# Patient Record
Sex: Male | Born: 1985 | Race: White | Hispanic: No | Marital: Married | Smoking: Never smoker
Health system: Southern US, Community
[De-identification: ages and names within clinical notes are randomized; demographics above are authoritative.]

---

## 2017-06-01 ENCOUNTER — Encounter: Payer: Self-pay | Admitting: Emergency Medicine

## 2017-06-01 ENCOUNTER — Emergency Department: Payer: Self-pay

## 2017-06-01 ENCOUNTER — Emergency Department
Admission: EM | Admit: 2017-06-01 | Discharge: 2017-06-01 | Disposition: A | Discharge status: Home / Self Care | Payer: Self-pay | Attending: Emergency Medicine | Admitting: Emergency Medicine

## 2017-06-01 ENCOUNTER — Other Ambulatory Visit: Payer: Self-pay

## 2017-06-01 DIAGNOSIS — Y9389 Activity, other specified: Secondary | ICD-10-CM | POA: Insufficient documentation

## 2017-06-01 DIAGNOSIS — Y92013 Bedroom of single-family (private) house as the place of occurrence of the external cause: Secondary | ICD-10-CM | POA: Insufficient documentation

## 2017-06-01 DIAGNOSIS — S0300XA Dislocation of jaw, unspecified side, initial encounter: Secondary | ICD-10-CM

## 2017-06-01 DIAGNOSIS — S0302XA Dislocation of jaw, left side, initial encounter: Secondary | ICD-10-CM | POA: Insufficient documentation

## 2017-06-01 DIAGNOSIS — X58XXXA Exposure to other specified factors, initial encounter: Secondary | ICD-10-CM | POA: Insufficient documentation

## 2017-06-01 DIAGNOSIS — Y999 Unspecified external cause status: Secondary | ICD-10-CM | POA: Insufficient documentation

## 2017-06-01 MED ORDER — MIDAZOLAM HCL 5 MG/5ML IJ SOLN: 2.0000 mg | Freq: Once | INTRAMUSCULAR | Status: DC

## 2017-06-01 NOTE — ED Notes (Signed)
Patient transported to CT 

## 2017-06-01 NOTE — ED Notes (Signed)
Pt back to room att 

## 2017-06-01 NOTE — ED Triage Notes (Signed)
Patient dislocated his jaw about 20 minutes ago.

## 2017-06-01 NOTE — ED Notes (Signed)
Pt reports waking up to left side jaw pain with the inability to close mouth completely,   Jaw appears shifted to the right, reduced ability to swallow, speech deficit, pt talkitive, accompanied by brother, no apparent distress

## 2017-06-01 NOTE — ED Provider Notes (Signed)
   [facility] Emergency Department Provider Note     PROCEDURES  Procedure(s) performed: no  Reduction of dislocation Date/Time: 06/01/2017 6:56 AM Performed by: Merrily Brittleifenbark, [place], MD Authorized by: Merrily Brittleifenbark, Airen Stiehl, MD  Consent: Verbal consent obtained. Consent given by: patient Patient understanding: patient states understanding of the procedure being performed Patient consent: the patient's understanding of the procedure matches consent given Procedure consent: procedure consent matches procedure scheduled Relevant documents: relevant documents present and verified Test results: test results available and properly labeled Site marked: the operative site was marked Imaging studies: imaging studies available Local anesthesia used: no  Anesthesia: Local anesthesia used: no  Sedation: Patient sedated: no  Patient tolerance: Patient tolerated the procedure well with no immediate complications Comments: Successfully reduce the patient's left jaw dislocation using an extraoral technique.  He tolerated well with no complications.      ____________________________________________   FINAL CLINICAL IMPRESSION(S) / ED DIAGNOSES  Final diagnoses:  None         Merrily Brittleifenbark, Viviene Thurston, MD 06/01/17 [phone]

## 2017-06-01 NOTE — ED Provider Notes (Signed)
[facility] Emergency Department Provider Note   ____________________________________________   First MD Initiated Contact with Patient 06/01/17 [id number]     (approximate)  I have reviewed the triage vital signs and the nursing notes.   HISTORY  Chief Complaint Jaw Pain    HPI Mitchell Clarke is a 32 y.o. male who presents to the ED from home with a chief complaint of left jaw dislocation.  No history of same.  Patient was getting ready to go to bed, popped the left side of his neck and now unable to close his jaw.  States he has been having a left upper toothache and biting on a fleshy part of his cheek prior to the incident.  Denies fall/trauma/injury.  Reports eating pizza and drinking beers up until 4 hours ago.  Voices no other medical complaints.  This effectively denies recent fever, chills, chest pain, shortness of breath, abdominal pain, nausea, vomiting.  Does state that his jaw usually clicks when he eats.   Past medical history None  There are no active problems to display for this patient.   History reviewed. No pertinent surgical history.  Prior to Admission medications   Not on File    Allergies Sulfa antibiotics  No family history on file.  Social History Social History   Tobacco Use  . Smoking status: Never Smoker  . Smokeless tobacco: Never Used  Substance Use Topics  . Alcohol use: Not on file  . Drug use: Not on file    Review of Systems  Constitutional: No fever/chills. Eyes: No visual changes. ENT: Positive for left jaw issue.  No sore throat. Cardiovascular: Denies chest pain. Respiratory: Denies shortness of breath. Gastrointestinal: No abdominal pain.  No nausea, no vomiting.  No diarrhea.  No constipation. Genitourinary: Negative for dysuria. Musculoskeletal: Negative for back pain. Skin: Negative for rash. Neurological: Negative for headaches, focal weakness or  numbness.   ____________________________________________   PHYSICAL EXAM:  VITAL SIGNS: ED Triage Vitals  Enc Vitals Group     BP 06/01/17 0546 (!) 143/101     Pulse Rate 06/01/17 0546 (!) 124     Resp 06/01/17 0546 18     Temp 06/01/17 0546 97.9 F (36.6 C)     Temp Source 06/01/17 0546 Oral     SpO2 06/01/17 0546 98 %     Weight 06/01/17 0545 270 lb (122.5 kg)     Height 06/01/17 0545 5\' 7"  (1.702 m)     Head Circumference --      Peak Flow --      Pain Score 06/01/17 0544 7     Pain Loc --      Pain Edu? --      Excl. in GC? --     Constitutional: Alert and oriented. Well appearing and in no acute distress.  Laughing with his brother. Eyes: Conjunctivae are normal. PERRL. EOMI. Head: Atraumatic. Nose: No congestion/rhinnorhea. Mouth/Throat: Left jaw unable to close.  Speaking clearly.  Able to widely open jaw without difficulty.  Small ball shaped fleshy tissue stemming from left cheek.  Patient feels like he is biting on this which prevents him from closing his mouth.  It does not appear to be the case that the tissue is preventing him from closing his mouth. Neck: No stridor.  Cervical spine tenderness to palpation. Cardiovascular: Normal rate, regular rhythm. Grossly normal heart sounds.  Good peripheral circulation. Respiratory: Normal respiratory effort.  No retractions. Lungs CTAB. Gastrointestinal: Soft and  nontender. No distention. No abdominal bruits. No CVA tenderness. Musculoskeletal: No lower extremity tenderness nor edema.  No joint effusions. Neurologic:  Normal speech and language. No gross focal neurologic deficits are appreciated. No gait instability. Skin:  Skin is warm, dry and intact. No rash noted. Psychiatric: Mood and affect are normal. Speech and behavior are normal.  ____________________________________________   LABS (all labs ordered are listed, but only abnormal results are displayed)  Labs Reviewed - No data to  display ____________________________________________  EKG  None ____________________________________________  RADIOLOGY  ED MD interpretation: Anterior left TMJ dislocation  Official radiology report(s): Ct Maxillofacial Wo Cm  Result Date: 06/01/2017 CLINICAL DATA:  Jaw dislocation EXAM: CT MAXILLOFACIAL WITHOUT CONTRAST TECHNIQUE: Multidetector CT imaging of the maxillofacial structures was performed. Multiplanar CT image reconstructions were also generated. COMPARISON:  None. FINDINGS: Osseous: --Complex facial fracture types: No LeFort, zygomaticomaxillary complex or nasoorbitoethmoidal fracture. --Simple fracture types: None. --Mandible, hard palate and teeth: There is anterior dislocation at the left temporomandibular joint. No fracture. Orbits: The globes appear intact. Normal appearance of the intra- and extraconal fat. Symmetric extraocular muscles. Sinuses: No fluid levels or advanced mucosal thickening. Soft tissues: Normal visualized extracranial soft tissues. Limited intracranial: Normal. IMPRESSION: Anterior dislocation of the left temporomandibular joint. No fracture. Electronically Signed   By: Deatra RobinsonKevin  Herman M.D.   On: 06/01/2017 06:57    ____________________________________________   PROCEDURES  Procedure(s) performed: None  Procedures  Critical Care performed: No  ____________________________________________   INITIAL IMPRESSION / ASSESSMENT AND PLAN / ED COURSE  As part of my medical decision making, I reviewed the following data within the electronic MEDICAL RECORD NUMBER History obtained from family, Nursing notes reviewed and incorporated, Radiograph reviewed and Notes from prior ED visits   32 year old male who presents with probable left jaw dislocation, unable to close.  Tried traditional reduction at the bedside without sedation.  Also attempted extraoral reduction without success.  Jaw appears to sublux.   Clinical Course as of Jun 01 701  Sun Jun 01, 2017  0701 My colleague Dr. Lamont Snowballifenbark attempted extraoral reduction successfully.  Patient is able to open and close his jaw fully without discomfort.  [facility].  Strict return precautions given.  Patient and his brother verbalize understanding and agree with plan of care.   [JS]    Clinical Course User Index [JS] Irean HongSung, Jade J, MD     ____________________________________________   FINAL CLINICAL IMPRESSION(S) / ED DIAGNOSES  Final diagnoses:  Closed dislocation of jaw, initial encounter     ED Discharge Orders    None       Note:  This document was prepared using Dragon voice recognition software and may include unintentional dictation errors.    Irean HongSung, Jade J, MD 06/01/17 [phone]

## 2017-06-01 NOTE — Discharge Instructions (Addendum)
Do not pop your neck like you were doing when your jaw dislocated.  Avoid opening your jaw very wide as much as possible.  Return to the ER for recurrent or worsening symptoms, persistent vomiting, difficulty breathing or other concerns.

## 2018-09-18 IMAGING — CT CT MAXILLOFACIAL W/O CM
3 series · 16 of 47 positions shown, 19 images · non-contrast
Comparison: None.

CLINICAL DATA: Jaw dislocation

EXAM:
CT MAXILLOFACIAL WITHOUT CONTRAST
TECHNIQUE: Multidetector CT imaging of the maxillofacial structures was
performed. Multiplanar CT image reconstructions were also generated.

[Series 2: max soft · axial · 0.36mm/px · z∈[-202,-34]mm · 10 of 98 slices shown, 13 images]
[im 7/98  brain]
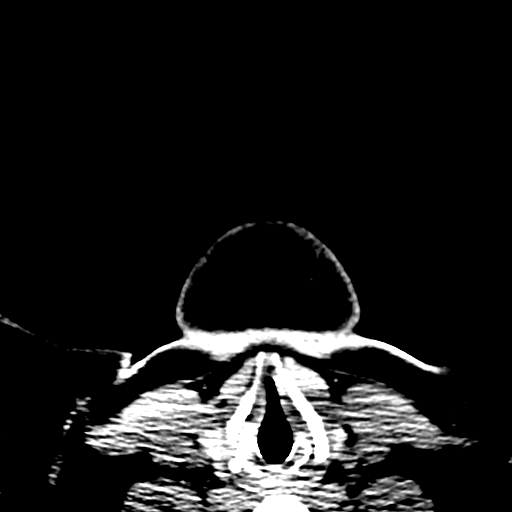
[im 7/98  bone]
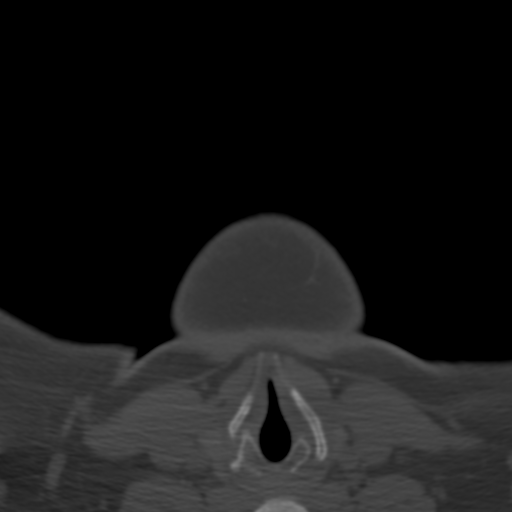
[im 17/98  bone]
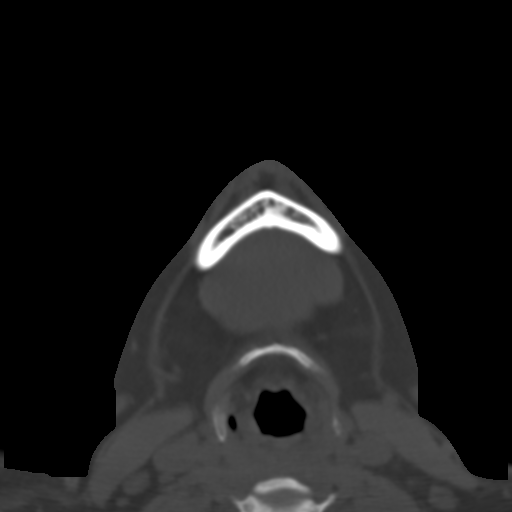
[im 27/98  bone]
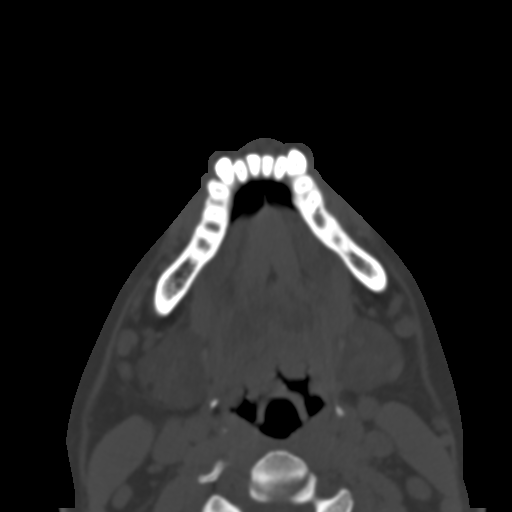
[im 34/98  bone]
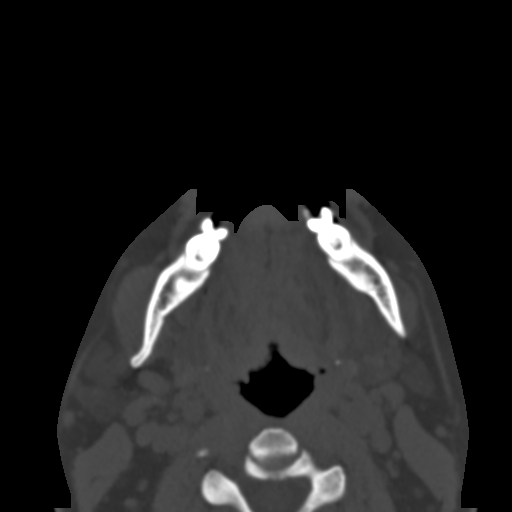
[im 44/98  brain]
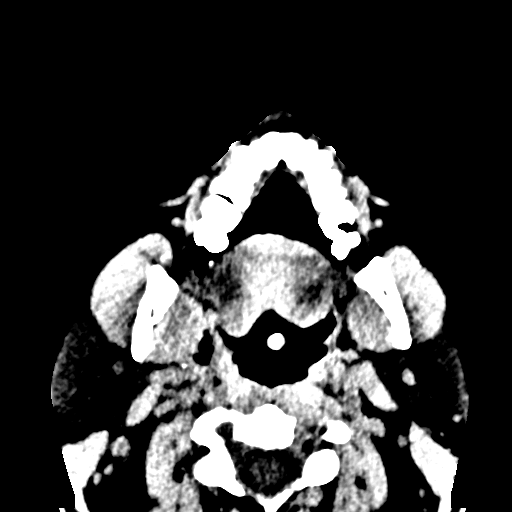
[im 44/98  bone]
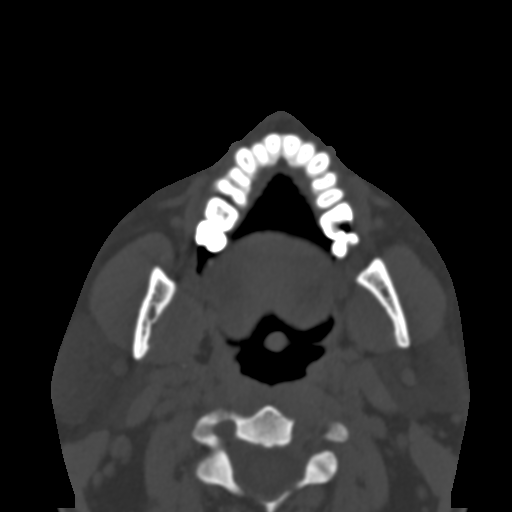
[im 54/98  bone]
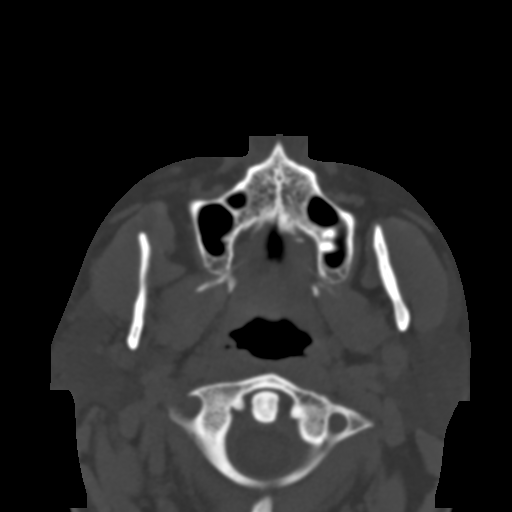
[im 64/98  bone]
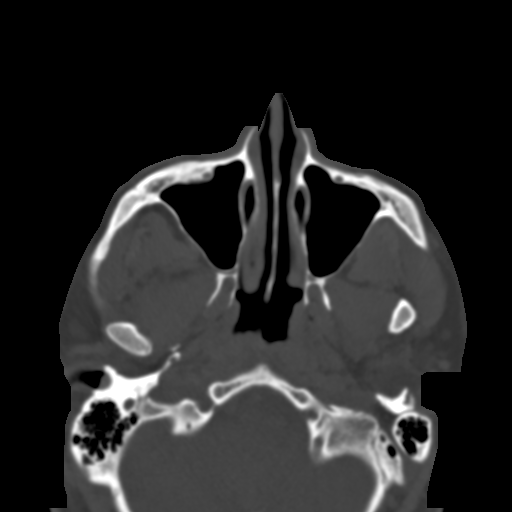
[im 74/98  bone]
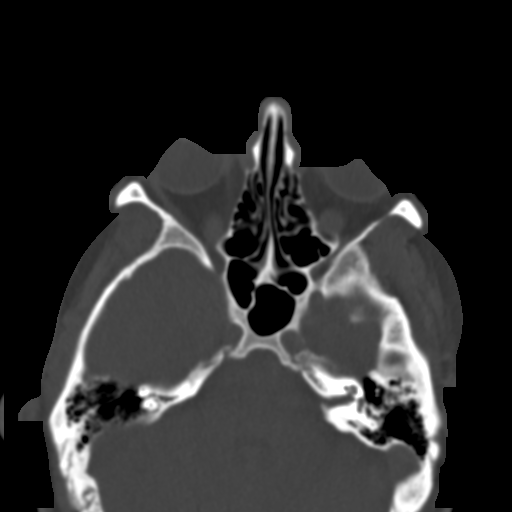
[im 81/98  brain]
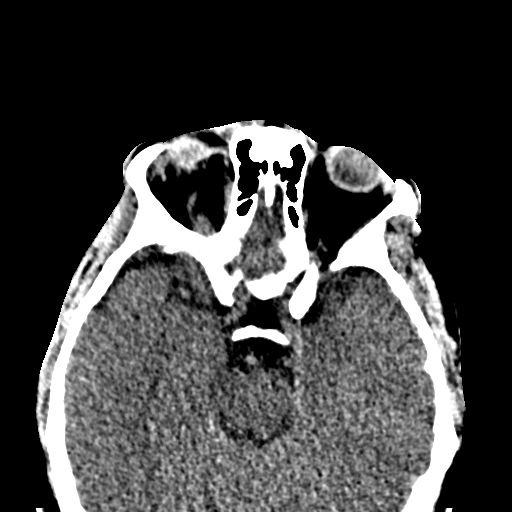
[im 81/98  bone]
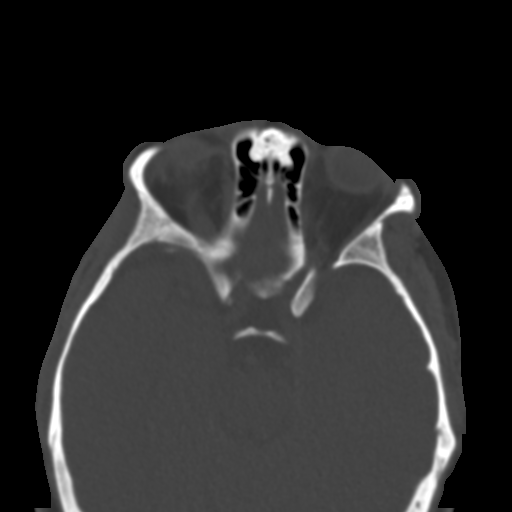
[im 91/98  bone]
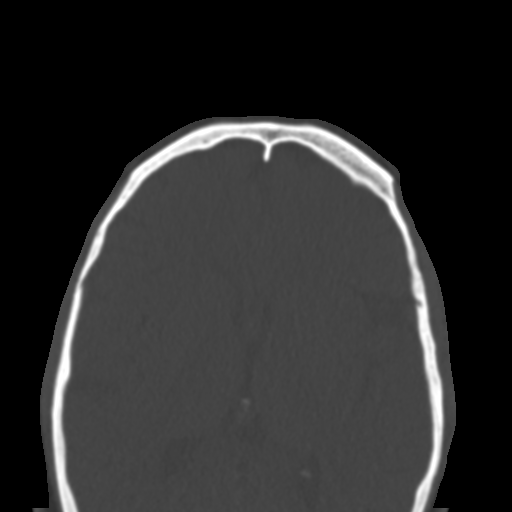

[Series 6: coronal soft · coronal · 0.34mm/px · 3 of 86 slices shown]
[im 29/86  bone]
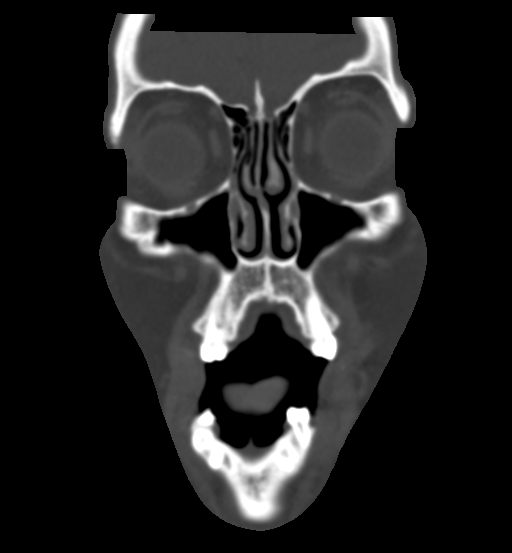
[im 38/86  bone]
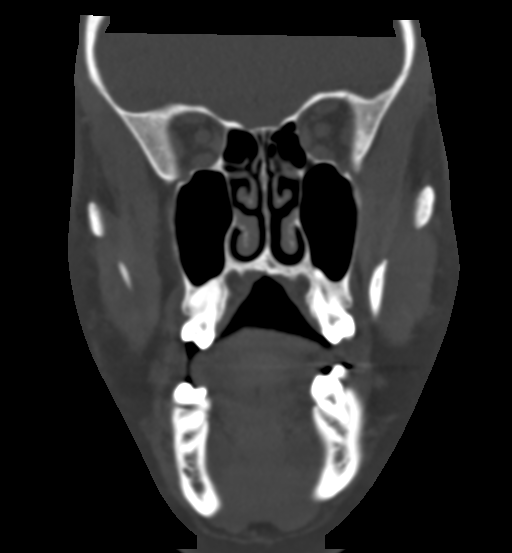
[im 48/86  bone]
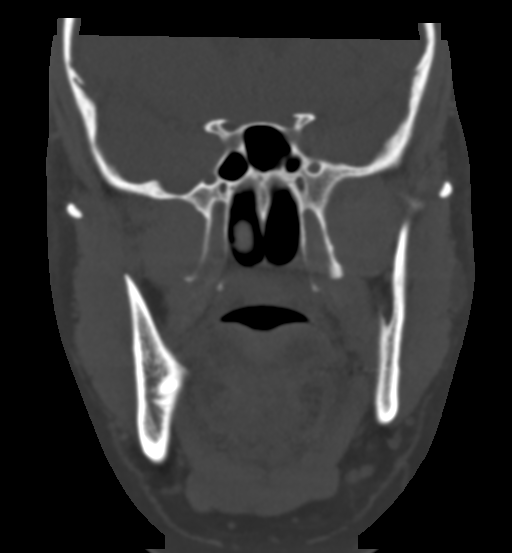

[Series 7: sagittal soft · sagittal · 0.34mm/px · 3 of 87 slices shown]
[im 29/87  bone]
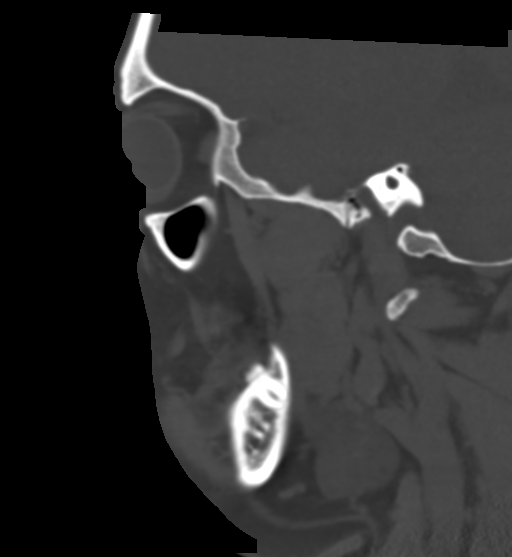
[im 44/87  bone]
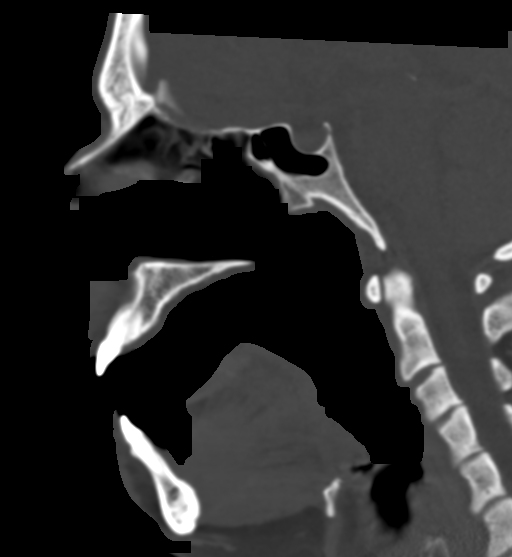
[im 58/87  bone]
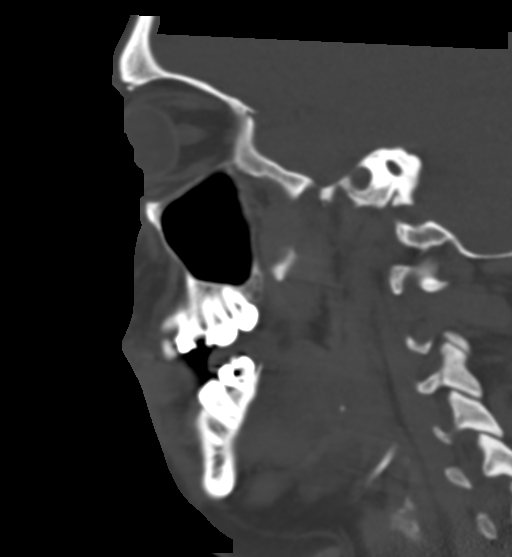

[16 of 47 positions shown; findings below may reference images not displayed]

FINDINGS: Osseous:

--Complex facial fracture types: No LeFort, zygomaticomaxillary
complex or nasoorbitoethmoidal fracture.

--Simple fracture types: None.

--Mandible, hard palate and teeth: There is anterior dislocation at
the left temporomandibular joint. No fracture.

Orbits: The globes appear intact. Normal appearance of the intra-
and extraconal fat. Symmetric extraocular muscles.

Sinuses: No fluid levels or advanced mucosal thickening.

Soft tissues: Normal visualized extracranial soft tissues.

Limited intracranial: Normal.
IMPRESSION: Anterior dislocation of the left temporomandibular joint. No
fracture.
